# Patient Record
Sex: Male | Born: 1995 | Race: White | Hispanic: No | Marital: Married | Smoking: Former smoker
Health system: Southern US, Community
[De-identification: ages and names within clinical notes are randomized; demographics above are authoritative.]

## PROBLEM LIST (undated history)

## (undated) DIAGNOSIS — F909 Attention-deficit hyperactivity disorder, unspecified type: Secondary | ICD-10-CM

## (undated) DIAGNOSIS — F32A Depression, unspecified: Secondary | ICD-10-CM

## (undated) DIAGNOSIS — F419 Anxiety disorder, unspecified: Secondary | ICD-10-CM

## (undated) DIAGNOSIS — F329 Major depressive disorder, single episode, unspecified: Secondary | ICD-10-CM

## (undated) DIAGNOSIS — J45909 Unspecified asthma, uncomplicated: Secondary | ICD-10-CM

## (undated) HISTORY — PX: APPENDECTOMY: SHX54

---
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
## 2017-02-09 ENCOUNTER — Encounter: Payer: Self-pay | Admitting: Allergy and Immunology

## 2017-02-09 ENCOUNTER — Ambulatory Visit (INDEPENDENT_AMBULATORY_CARE_PROVIDER_SITE_OTHER): Payer: Medicaid Other | Admitting: Allergy and Immunology

## 2017-02-09 VITALS — BP 130/88 | HR 60 | Temp 98.4°F | Resp 16 | Ht 66.8 in | Wt 151.4 lb

## 2017-02-09 DIAGNOSIS — J111 Influenza due to unidentified influenza virus with other respiratory manifestations: Secondary | ICD-10-CM

## 2017-02-09 DIAGNOSIS — J4531 Mild persistent asthma with (acute) exacerbation: Secondary | ICD-10-CM

## 2017-02-09 DIAGNOSIS — Z720 Tobacco use: Secondary | ICD-10-CM | POA: Diagnosis not present

## 2017-02-09 MED ORDER — FLUTICASONE PROPIONATE 50 MCG/ACT NA SUSP: NASAL | 5 refills | Status: DC

## 2017-02-09 MED ORDER — MONTELUKAST SODIUM 10 MG PO TABS: ORAL_TABLET | ORAL | 5 refills | Status: DC

## 2017-02-09 MED ORDER — BUDESONIDE-FORMOTEROL FUMARATE 160-4.5 MCG/ACT IN AERO: INHALATION_SPRAY | RESPIRATORY_TRACT | 5 refills | Status: DC

## 2017-02-09 NOTE — Patient Instructions (Addendum)
  1. Finish Tamiflu administration  2. Treat inflammation:   A. prednisone 10 mg - one tablet 1 time per day for 10 days  B. Symbicort 160 - 2 inhalations twice a day with spacer  C. montelukast 10 mg - one tablet once a day  D. Flonase - one spray each nostril once a day  3. If needed:   A. nasal saline multiple times a day  B. loratadine 10 mg - one tablet twice a day  C. OTC Mucinex DM - 2 tablets twice a day  D. Proair HFA or similar - 2 inhalations every 4-6 hours  4. Review previous chest x-rays  5. Contact clinic if worse or develop fever  6. Return to clinic in 2 weeks or earlier if problem  7. Stay away from tobacco products

## 2017-02-09 NOTE — Progress Notes (Signed)
Dear Dr. Brigid Re,  Thank you for referring Calvin Riley to the [facility] [place] on 02/09/2017.   Below is a summation of this patient's evaluation and recommendations.  Thank you for your referral. I will keep you informed about this patient's response to treatment.   If you have any questions please do not hesitate to contact me.   Sincerely,  Jessica Priest, MD Allergy / Immunology [place] [facility]   ______________________________________________________________________    NEW PATIENT NOTE  Referring Provider: Loma Messing, MD Primary Provider: Loma Messing, MD Date of office visit: 02/09/2017    Subjective:   Chief Complaint:  Calvin Riley (DOB: [birth year]) is a 21 y.o. male who presents to the clinic on 02/09/2017 with a chief complaint of Cough .     HPI: Calvin Riley presents to this clinic in evaluation of cough. Apparently he has had 4 months of cough. Tied up with this cough has been 2 episodes of swab positive influenza with his most recent bout occurring last week evaluated and treated in the emergency room setting.  Currently he presents with a history of cough that does not appear to disturb his sleep in conjunction with nasal congestion and snorting but no ugly nasal discharge or anosmia. He does not have a long history of upper airway symptoms and although he has a distant history of asthma this condition has been inactive for many years and he has not used a bronchodilator in years. He does not make any ugly sputum production or have any chest pain and he can still exercise without any problem and he can still have exposure to cold air without any problem.  Apparently a chest x-ray obtained in the emergency room setting was without any significant abnormality.  He has been treated with many medications including what sounds like an inhaled steroid for the past several  months and a short acting bronchodilator which he is not really sure has helped him very much and 2 courses of Tamiflu and various cough suppressants.  Past Medical History:  Diagnosis Date  . ADHD (attention deficit hyperactivity disorder)   . Anxiety   . Asthma   . Depression     Past Surgical History:  Procedure Laterality Date  . APPENDECTOMY      Allergies as of 02/09/2017      Reactions   Amoxicillin    Zithromax [azithromycin]       Medication List      albuterol 108 (90 Base) MCG/ACT inhaler Commonly known as:  PROVENTIL HFA;VENTOLIN HFA Inhale 2 puffs into the lungs every 6 (six) hours as needed for wheezing or shortness of breath.       Review of systems negative except as noted in HPI / PMHx or noted below:  Review of Systems  Constitutional: Negative.   HENT: Negative.   Eyes: Negative.   Respiratory: Negative.   Cardiovascular: Negative.   Gastrointestinal: Negative.   Genitourinary: Negative.   Musculoskeletal: Negative.   Skin: Negative.   Neurological: Negative.   Endo/Heme/Allergies: Negative.   Psychiatric/Behavioral: Negative.     Family History  Problem Relation Age of Onset  . Family history unknown: Yes    Social History   Social History  . Marital status: Married    Spouse name: N/A  . Number of children: N/A  . Years of education: N/A   Occupational History  . Not on file.   Social  History Main Topics  . Smoking status: Former Smoker    Packs/day: 0.25    Types: Cigarettes    Quit date: 11/23/2014  . Smokeless tobacco: Current User    Last attempt to quit: 01/14/2015  . Alcohol use No  . Drug use: No  . Sexual activity: Not on file   Other Topics Concern  . Not on file   Social History Narrative  . No narrative on file    Environmental and Social history  Lives in a apartment with a dry environment, cats and dogs located inside the household, carpeting in the bedroom, no plastic on the bed or pillow, and no  smokers located inside the household. He occasionally chews tobacco.  Objective:   Vitals:   02/09/17 0914  BP: 130/88  Pulse: 60  Resp: 16  Temp: 98.4 F (36.9 C)   Height: 5' 6.8" (169.7 cm) Weight: 151 lb 6.4 oz (68.7 kg)  Physical Exam  Constitutional: He is well-developed, well-nourished, and in no distress.  Coughing, snorting  HENT:  Head: Normocephalic. Head is without right periorbital erythema and without left periorbital erythema.  Right Ear: Tympanic membrane, external ear and ear canal normal.  Left Ear: Tympanic membrane, external ear and ear canal normal.  Nose: Mucosal edema present. No rhinorrhea.  Mouth/Throat: Uvula is midline, oropharynx is clear and moist and mucous membranes are normal. No oropharyngeal exudate.  Eyes: Conjunctivae and lids are normal. Pupils are equal, round, and reactive to light.  Neck: Trachea normal. No tracheal tenderness present. No tracheal deviation present. No thyromegaly present.  Cardiovascular: Normal rate, regular rhythm, S1 normal, S2 normal and normal heart sounds.   No murmur heard. Pulmonary/Chest: Effort normal and breath sounds normal. No stridor. No tachypnea. No respiratory distress. He has no wheezes. He has no rales. He exhibits no tenderness.  Abdominal: Soft. He exhibits no distension and no mass. There is no hepatosplenomegaly. There is no tenderness. There is no rebound and no guarding.  Musculoskeletal: He exhibits no edema or tenderness.  Lymphadenopathy:       Head (right side): No tonsillar adenopathy present.       Head (left side): No tonsillar adenopathy present.    He has no cervical adenopathy.    He has no axillary adenopathy.  Neurological: He is alert. Gait normal.  Skin: No rash noted. He is not diaphoretic. No erythema. No pallor. Nails show no clubbing.  Psychiatric: Mood and affect normal.    Diagnostics: Allergy skin tests were not performed.   Spirometry was performed and demonstrated an  FEV1 of 3.31 @ 82 % of predicted. Following the administration of nebulized albuterol his FEV1 rose to 3.84 which calculated out to an increase in the FEV1 of 16%.   Assessment and Plan:    1. Asthma, not well controlled, mild persistent, with acute exacerbation   2. Influenza   3. Tobacco use     1. Finish Tamiflu administration  2. Treat inflammation:   A. prednisone 10 mg - one tablet 1 time per day for 10 days  B. Symbicort 160 - 2 inhalations twice a day with spacer  C. montelukast 10 mg - one tablet once a day  D. Flonase - one spray each nostril once a day  3. If needed:   A. nasal saline multiple times a day  B. loratadine 10 mg - one tablet twice a day  C. OTC Mucinex DM - 2 tablets twice a day  D. Proair HFA or  similar - 2 inhalations every 4-6 hours  4. Review previous chest x-rays  5. Contact clinic if worse or develop fever  6. Return to clinic in 2 weeks or earlier if problem  7. Stay away from tobacco products  I will assume that Chanetta Marshall has inflammation of his respiratory tract possibly secondary to her asthma but with his history of 2 episodes of influenza over the course of the past winter he very well could just have post viral inflammation and cough that will go on for an additional 4-8 weeks. I've treated him with anti-inflammatory medications as noted above addressing inflammation of his upper and lower airway. I'll see him back in this clinic in 2 weeks to see what type of response we get with this approach and consider further evaluation and treatment based upon his response.  Jessica Priest, MD [place] [facility] [place]

## 2017-02-19 ENCOUNTER — Ambulatory Visit: Payer: Medicaid Other | Admitting: Allergy and Immunology

## 2017-02-23 ENCOUNTER — Encounter: Payer: Self-pay | Admitting: Allergy and Immunology

## 2017-02-23 ENCOUNTER — Ambulatory Visit (INDEPENDENT_AMBULATORY_CARE_PROVIDER_SITE_OTHER): Payer: Medicaid Other | Admitting: Allergy and Immunology

## 2017-02-23 VITALS — BP 120/70 | HR 80 | Resp 16

## 2017-02-23 DIAGNOSIS — J4541 Moderate persistent asthma with (acute) exacerbation: Secondary | ICD-10-CM

## 2017-02-23 DIAGNOSIS — J329 Chronic sinusitis, unspecified: Secondary | ICD-10-CM

## 2017-02-23 DIAGNOSIS — Z720 Tobacco use: Secondary | ICD-10-CM | POA: Diagnosis not present

## 2017-02-23 DIAGNOSIS — K219 Gastro-esophageal reflux disease without esophagitis: Secondary | ICD-10-CM | POA: Diagnosis not present

## 2017-02-23 DIAGNOSIS — J3089 Other allergic rhinitis: Secondary | ICD-10-CM

## 2017-02-23 MED ORDER — OMEPRAZOLE 40 MG PO CPDR: 40.0000 mg | DELAYED_RELEASE_CAPSULE | Freq: Every day | ORAL | 5 refills | Status: DC

## 2017-02-23 MED ORDER — LORATADINE 10 MG PO TABS: ORAL_TABLET | ORAL | 5 refills | Status: DC

## 2017-02-23 MED ORDER — METHYLPREDNISOLONE ACETATE 80 MG/ML IJ SUSP
80.0000 mg | Freq: Once | INTRAMUSCULAR | Status: AC
  Administered 2017-02-23: 80 mg via INTRAMUSCULAR

## 2017-02-23 MED ORDER — CEFDINIR 300 MG PO CAPS: ORAL_CAPSULE | ORAL | 0 refills | Status: DC

## 2017-02-23 NOTE — Progress Notes (Signed)
Follow-up Note  Referring Provider: Loma Messing, MD Primary Provider: Loma Messing, MD Date of Office Visit: 02/23/2017  Subjective:   Calvin Riley (DOB: [birth year]) is a 21 y.o. male who [facility] on 02/23/2017 in re-evaluation of the following:  HPI: Calvin Riley returns to this clinic in reevaluation of his cough. He was last seen in this clinic on 02/09/2017 at which point in time we made the assumption that his cough was a postinfectious form of cough with a possible component of inflammation affecting his respiratory tract that may of had an atopic nature.  Although he is better somewhat with this cough in that he is not coughing as much he still has a lot of throat clearing and he still has some occasional sniffing and nasal congestion although he has no anosmia or ugly nasal discharge and has no raspy voice and has no classic reflux symptoms. He has not been having any wheezing or shortness of breath. He does not use a short acting bronchodilator but has been good about using his other medications.  He still continues to dip tobacco products  Allergies as of 02/23/2017      Reactions   Amoxicillin    Zithromax [azithromycin]       Medication List      budesonide-formoterol 160-4.5 MCG/ACT inhaler Commonly known as:  SYMBICORT Inhale two puffs twice daily to prevent cough or wheeze. Rinse mouth after use. Use with spacer.   fluticasone 50 MCG/ACT nasal spray Commonly known as:  FLONASE Use one spray in each nostril once daily   montelukast 10 MG tablet Commonly known as:  SINGULAIR Take one tablet once daily as directed   PROAIR HFA 108 (90 Base) MCG/ACT inhaler Generic drug:  albuterol Inhale 2 puffs into the lungs every 4 (four) hours as needed for wheezing or shortness of breath.       Past Medical History:  Diagnosis Date  . ADHD (attention deficit hyperactivity disorder)   . Anxiety   . Asthma   . Depression      Past Surgical History:  Procedure Laterality Date  . APPENDECTOMY      Review of systems negative except as noted in HPI / PMHx or noted below:  Review of Systems  Constitutional: Negative.   HENT: Negative.   Eyes: Negative.   Respiratory: Negative.   Cardiovascular: Negative.   Gastrointestinal: Negative.   Genitourinary: Negative.   Musculoskeletal: Negative.   Skin: Negative.   Neurological: Negative.   Endo/Heme/Allergies: Negative.   Psychiatric/Behavioral: Negative.      Objective:   Vitals:   02/23/17 1752  BP: 120/70  Pulse: 80  Resp: 16          Physical Exam  Constitutional: He is well-developed, well-nourished, and in no distress.  Constant throat clearing, sniffing  HENT:  Head: Normocephalic.  Right Ear: Tympanic membrane, external ear and ear canal normal.  Left Ear: Tympanic membrane, external ear and ear canal normal.  Nose: Nose normal. No mucosal edema or rhinorrhea.  Mouth/Throat: Uvula is midline, oropharynx is clear and moist and mucous membranes are normal. No oropharyngeal exudate.  Eyes: Conjunctivae are normal.  Neck: Trachea normal. No tracheal tenderness present. No tracheal deviation present. No thyromegaly present.  Cardiovascular: Normal rate, regular rhythm, S1 normal, S2 normal and normal heart sounds.   No murmur heard. Pulmonary/Chest: Breath sounds normal. No stridor. No respiratory distress. He has no wheezes. He has no rales.  Musculoskeletal: He  exhibits no edema.  Lymphadenopathy:       Head (right side): No tonsillar adenopathy present.       Head (left side): No tonsillar adenopathy present.    He has no cervical adenopathy.  Neurological: He is alert. Gait normal.  Skin: No rash noted. He is not diaphoretic. No erythema. Nails show no clubbing.  Psychiatric: Mood and affect normal.    Diagnostics: Review her chest x-ray dated 02/03/2017 identified no active cardiopulmonary disease.   Spirometry was performed  and demonstrated an FEV1 of 3.25 at 80 % of predicted.   Assessment and Plan:   1. Asthma, not well controlled, moderate persistent, with acute exacerbation   2. Other allergic rhinitis   3. Chronic sinusitis, unspecified location   4. LPRD (laryngopharyngeal reflux disease)   5. Tobacco use     1. Treat infection:   A. Cefdinir  two tablets one time per day for 14 days  2. Treat reflux:   A. Omeprazole  one tablet one time per day  B. Aim to eliminate all caffeine and chocolate consumption  2. Treat inflammation:   A. Symbicort 160 - 2 inhalations twice a day with spacer  B. montelukast 10 mg - one tablet once a day  C. Flonase - one spray each nostril once a day  D. Depomedrol 80 IM delivered in clinic today  3. If needed:   A. nasal saline multiple times a day  B. loratadine 10 mg - one tablet twice a day  C. OTC Mucinex DM - 2 tablets twice a day  D. Proair HFA or similar - 2 inhalations every 4-6 hours  4. Return to clinic in 4 weeks or earlier if problem  5. Stay away from tobacco products  Calvin Riley will use a very large collection of medical therapy in an attempt to address both inflammation, chronic infection, and reflux in the hope of getting all his respiratory tract inflammation and irritation under good control. We will keep him on this plan for 4 weeks. I suspect that he will do very well and we'll not have any respiratory tract symptoms at that point and then will begin to taper his medications. If he has difficulty during the interval he will contact me for further evaluation and treatment.  Laurette Schimke, MD Allergy / Immunology [facility]

## 2017-02-23 NOTE — Patient Instructions (Addendum)
  1. Treat infection:   A. Cefdinir  two tablets one time per day for 14 days  2. Treat reflux:   A. Omeprazole  one tablet one time per day  B. Aim to eliminate all caffeine and chocolate consumption  2. Treat inflammation:   A. Symbicort 160 - 2 inhalations twice a day with spacer  B. montelukast 10 mg - one tablet once a day  C. Flonase - one spray each nostril once a day  D. Depomedrol 80 IM delivered in clinic today  3. If needed:   A. nasal saline multiple times a day  B. loratadine 10 mg - one tablet twice a day  C. OTC Mucinex DM - 2 tablets twice a day  D. Proair HFA or similar - 2 inhalations every 4-6 hours  4. Return to clinic in 4 weeks or earlier if problem  5. Stay away from tobacco products

## 2017-03-23 ENCOUNTER — Ambulatory Visit: Payer: Medicaid Other | Admitting: Allergy and Immunology

## 2018-08-27 ENCOUNTER — Encounter (HOSPITAL_COMMUNITY): Payer: Self-pay | Admitting: *Deleted

## 2018-08-27 ENCOUNTER — Emergency Department (HOSPITAL_COMMUNITY)
Admission: EM | Admit: 2018-08-27 | Discharge: 2018-08-27 | Disposition: A | Discharge status: Home / Self Care | Payer: Self-pay | Attending: Emergency Medicine | Admitting: Emergency Medicine

## 2018-08-27 ENCOUNTER — Emergency Department (HOSPITAL_COMMUNITY): Payer: Self-pay

## 2018-08-27 DIAGNOSIS — R112 Nausea with vomiting, unspecified: Secondary | ICD-10-CM | POA: Insufficient documentation

## 2018-08-27 DIAGNOSIS — J45909 Unspecified asthma, uncomplicated: Secondary | ICD-10-CM | POA: Insufficient documentation

## 2018-08-27 DIAGNOSIS — Z87891 Personal history of nicotine dependence: Secondary | ICD-10-CM | POA: Insufficient documentation

## 2018-08-27 DIAGNOSIS — R079 Chest pain, unspecified: Secondary | ICD-10-CM

## 2018-08-27 LAB — CBC
HCT: 50.8 % (ref 39.0–52.0)
HEMOGLOBIN: 17 g/dL (ref 13.0–17.0)
MCH: 28.8 pg (ref 26.0–34.0)
MCHC: 33.5 g/dL (ref 30.0–36.0)
MCV: 86.1 fL (ref 80.0–100.0)
Platelets: 206 10*3/uL (ref 150–400)
RBC: 5.9 MIL/uL — ABNORMAL HIGH (ref 4.22–5.81)
RDW: 11.6 % (ref 11.5–15.5)
WBC: 15.7 10*3/uL — AB (ref 4.0–10.5)
nRBC: 0 % (ref 0.0–0.2)

## 2018-08-27 LAB — BASIC METABOLIC PANEL
Anion gap: 12 (ref 5–15)
BUN: 17 mg/dL (ref 6–20)
CO2: 21 mmol/L — ABNORMAL LOW (ref 22–32)
CREATININE: 1.07 mg/dL (ref 0.61–1.24)
Calcium: 9.9 mg/dL (ref 8.9–10.3)
Chloride: 106 mmol/L (ref 98–111)
GFR calc Af Amer: >60 mL/min (ref >60)
Glucose, Bld: 107 mg/dL — ABNORMAL HIGH (ref 70–99)
POTASSIUM: 3.9 mmol/L (ref 3.5–5.1)
SODIUM: 139 mmol/L (ref 135–145)

## 2018-08-27 LAB — URINALYSIS, ROUTINE W REFLEX MICROSCOPIC
Bacteria, UA: NONE SEEN
Bilirubin Urine: NEGATIVE
GLUCOSE, UA: NEGATIVE mg/dL
HGB URINE DIPSTICK: NEGATIVE
Ketones, ur: NEGATIVE mg/dL
Leukocytes, UA: NEGATIVE
NITRITE: NEGATIVE
Protein, ur: NEGATIVE mg/dL
SPECIFIC GRAVITY, URINE: 1.023 (ref 1.005–1.030)
pH: 5 (ref 5.0–8.0)

## 2018-08-27 LAB — LIPASE, BLOOD: Lipase: 25 U/L (ref 11–51)

## 2018-08-27 LAB — HEPATIC FUNCTION PANEL
ALT: 21 U/L (ref 0–44)
AST: 24 U/L (ref 15–41)
Albumin: 5 g/dL (ref 3.5–5.0)
Alkaline Phosphatase: 71 U/L (ref 38–126)
Bilirubin, Direct: 0.3 mg/dL — ABNORMAL HIGH (ref 0.0–0.2)
Indirect Bilirubin: 1.2 mg/dL — ABNORMAL HIGH (ref 0.3–0.9)
Total Bilirubin: 1.5 mg/dL — ABNORMAL HIGH (ref 0.3–1.2)
Total Protein: 7.7 g/dL (ref 6.5–8.1)

## 2018-08-27 LAB — I-STAT TROPONIN, ED: TROPONIN I, POC: 0 ng/mL (ref 0.00–0.08)

## 2018-08-27 MED ORDER — SODIUM CHLORIDE 0.9 % IV BOLUS
1000.0000 mL | Freq: Once | INTRAVENOUS | Status: AC
Start: 2018-08-27 — End: 2018-08-27
  Administered 2018-08-27: 1000 mL via INTRAVENOUS

## 2018-08-27 MED ORDER — SODIUM CHLORIDE 0.9 % IV BOLUS
1000.0000 mL | Freq: Once | INTRAVENOUS | Status: AC
  Administered 2018-08-27: 1000 mL via INTRAVENOUS

## 2018-08-27 MED ORDER — ONDANSETRON HCL 4 MG PO TABS: 4.0000 mg | ORAL_TABLET | Freq: Four times a day (QID) | ORAL | 0 refills | Status: AC

## 2018-08-27 MED ORDER — ONDANSETRON HCL 4 MG/2ML IJ SOLN
4.0000 mg | Freq: Once | INTRAMUSCULAR | Status: AC
  Administered 2018-08-27: 4 mg via INTRAVENOUS
  Filled 2018-08-27: qty 2

## 2018-08-27 MED ORDER — ACETAMINOPHEN 325 MG PO TABS: 650.0000 mg | ORAL_TABLET | Freq: Once | ORAL | Status: DC

## 2018-08-27 NOTE — ED Notes (Signed)
Pt's mother is requesting flu test for her son. She is concerned he might have the flu. Will notify MD and see what orders they want.

## 2018-08-27 NOTE — ED Triage Notes (Addendum)
Pt in c/o vomiting, chest pain and shortness of breath, concerned he has food poisoning from [employer] he had last night, woke up with symptoms, mother reports patient does use e-cigarretts and uses THC cartridges

## 2018-08-27 NOTE — ED Notes (Signed)
Patient verbalizes understanding of discharge instructions. Opportunity for questioning and answers were provided. Armband removed by staff, pt discharged from ED ambulatory with mother.  

## 2018-08-27 NOTE — ED Notes (Signed)
Patient transported to X-ray 

## 2018-08-27 NOTE — ED Provider Notes (Signed)
[facility] EMERGENCY DEPARTMENT Provider Note   CSN: [id number] Arrival date & time: 08/27/18  1339     History   Chief Complaint Chief Complaint  Patient presents with  . Chest Pain    HPI Calvin Riley is a 22 y.o. male.  Multiple episodes of nausea vomiting this morning.  Patient blames his symptoms on eating [employer] food last evenings.  Review of systems positive for chest pain, fever, right flank pain.  No obvious abdominal pain.  Past medical history includes depression, ADHD, PTSD.  Severity of symptoms is moderate.  Nothing makes symptoms better or worse.     Past Medical History:  Diagnosis Date  . ADHD (attention deficit hyperactivity disorder)   . Anxiety   . Asthma   . Depression     Patient Active Problem List   Diagnosis Date Noted  . Major depression, recurrent (HCC) 01/23/2015  . Suicidal ideation 01/23/2015  . ADHD (attention deficit hyperactivity disorder), combined type 01/23/2015  . Generalized anxiety disorder 01/23/2015  . PTSD (post-traumatic stress disorder) 01/23/2015  . 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 APPENDECTOMY          Home Medications    Prior to Admission medications   Not on File    Family History Family History  Family history unknown: Yes    Social History Social History   Tobacco Use  . Smoking status: Former Smoker    Packs/day: 0.25    Types: Cigarettes    Last attempt to quit: 11/23/2014    Years since quitting: 3.7  . Smokeless tobacco: Current User    Last attempt to quit: 01/14/2015  Substance Use Topics  . Alcohol use: No  . Drug use: No     Allergies   Amoxicillin and Zithromax [azithromycin]   Review of Systems Review of Systems  All other systems reviewed and are negative.    Physical Exam Updated Vital Signs BP 123/88 (BP Location: Right Arm)   Pulse (!) 112   Temp 98 F (36.7  C) (Oral)   Resp (!) 22   SpO2 100%   Physical Exam  Constitutional: He is oriented to person, place, and time. He appears well-developed and well-nourished.  Dry mucous membranes.  HENT:  Head: Normocephalic and atraumatic.  Eyes: Conjunctivae are normal.  Neck: Neck supple.  Cardiovascular: Normal rate and regular rhythm.  Pulmonary/Chest: Effort normal and breath sounds normal.  Abdominal: Soft. Bowel sounds are normal.  Musculoskeletal: Normal range of motion.  Neurological: He is alert and oriented to person, place, and time.  Skin: Skin is warm and dry.  Psychiatric: He has a normal mood and affect. His behavior is normal.  Nursing note and vitals reviewed. GU: Right flank tenderness   ED Treatments / Results  Labs (all labs ordered are listed, but only abnormal results are displayed) Labs Reviewed  BASIC METABOLIC PANEL - Abnormal; Notable for the following components:      Result Value   CO2 21 (*)    Glucose, Bld 107 (*)    All other components within normal limits  CBC - Abnormal; Notable for the following components:   WBC 15.7 (*)    RBC 5.90 (*)    All other components within normal limits  HEPATIC FUNCTION PANEL  LIPASE, BLOOD  URINALYSIS, ROUTINE W REFLEX MICROSCOPIC  I-STAT TROPONIN, ED    EKG EKG Interpretation  Date/Time:  Friday August 27 2018 13:41:53 EDT Ventricular Rate:  113 PR Interval:  150 QRS Duration: 76 QT Interval:  294 QTC Calculation: 403 R Axis:   96 Text Interpretation:  Sinus tachycardia Rightward axis Septal infarct , age undetermined Abnormal ECG Confirmed by Donnetta Hutching ([id number]) on 08/27/2018 2:12:03 PM   Radiology Dg Chest 2 View  Result Date: 08/27/2018 CLINICAL DATA:  Chest pain and shortness of breath EXAM: CHEST - 2 VIEW COMPARISON:  None. FINDINGS: Lungs are clear. Heart size and pulmonary vascularity are normal. No adenopathy. No pneumothorax. No bone lesions. IMPRESSION: No edema or consolidation. Electronically  Signed   By: Bretta Bang III M.D.   On: 08/27/2018 14:51    Procedures Procedures (including critical care time)  Medications Ordered in ED Medications  sodium chloride 0.9 % bolus 1,000 mL (1,000 mLs Intravenous New Bag/Given 08/27/18 1426)  sodium chloride 0.9 % bolus 1,000 mL (has no administration in time range)  ondansetron (ZOFRAN) injection 4 mg (4 mg Intravenous Given 08/27/18 1425)     Initial Impression / Assessment and Plan / ED Course  I have reviewed the triage vital signs and the nursing notes.  Pertinent labs & imaging results that were available during my care of the patient were reviewed by me and considered in my medical decision making (see chart for details).     Patient presents with nausea, vomiting, chest pain, subjective fever, right flank pain.  He is mildly tachycardic.  White count 15.7.  He feels better after IV hydration.  Urinalysis pending.  No acute abdomen.  Discussed with Dr.Curatola.  Final Clinical Impressions(s) / ED Diagnoses   Final diagnoses:  Intractable vomiting with nausea, unspecified vomiting type  Chest pain, unspecified type    ED Discharge Orders    None       Donnetta Hutching, MD 08/27/18 1538

## 2018-08-27 NOTE — ED Provider Notes (Signed)
Assumed care of patient from Dr. Adriana Simas at 4 PM.  Patient likely with viral process.  Has had nausea and vomiting since this morning.  Ate suspicious food last night.  Patient thus far with unremarkable work-up, awaiting U/A.  Was mildly tachycardic when he came in that has improved with IV fluids.  Patient given IV Zofran.  Had unremarkable chest x-ray, EKG, labs.  Mild leukocytosis.  Likely from viral process.  Patient with no abdominal pain.  No signs to suggest intra-abdominal process such as appendicitis or gallbladder or liver etiology.  Urinalysis showed no signs of infection.  Patient given Tylenol.  Recommend increase hydration, Tylenol, Zofran at home.  Recommend follow-up with primary care doctor and told to return to ED if symptoms worsen.   Virgina Norfolk, DO 08/27/18 1637

## 2018-08-27 NOTE — ED Notes (Addendum)
Pt mother is concerned about possible kidney stones or gallbladder problems. Pts mother states "pt is having lower left abdominal pain and lower back pain. His sister also recently had her gallbladder removed and is concerned he needs the same". This RN informed the mother I would notify the MD of her concerns.

## 2020-04-06 IMAGING — CR DG CHEST 2V
2 series · 2 of 2 positions shown · non-contrast
Comparison: None.

CLINICAL DATA: Chest pain and shortness of breath

EXAM:
CHEST - 2 VIEW

[chest lat]
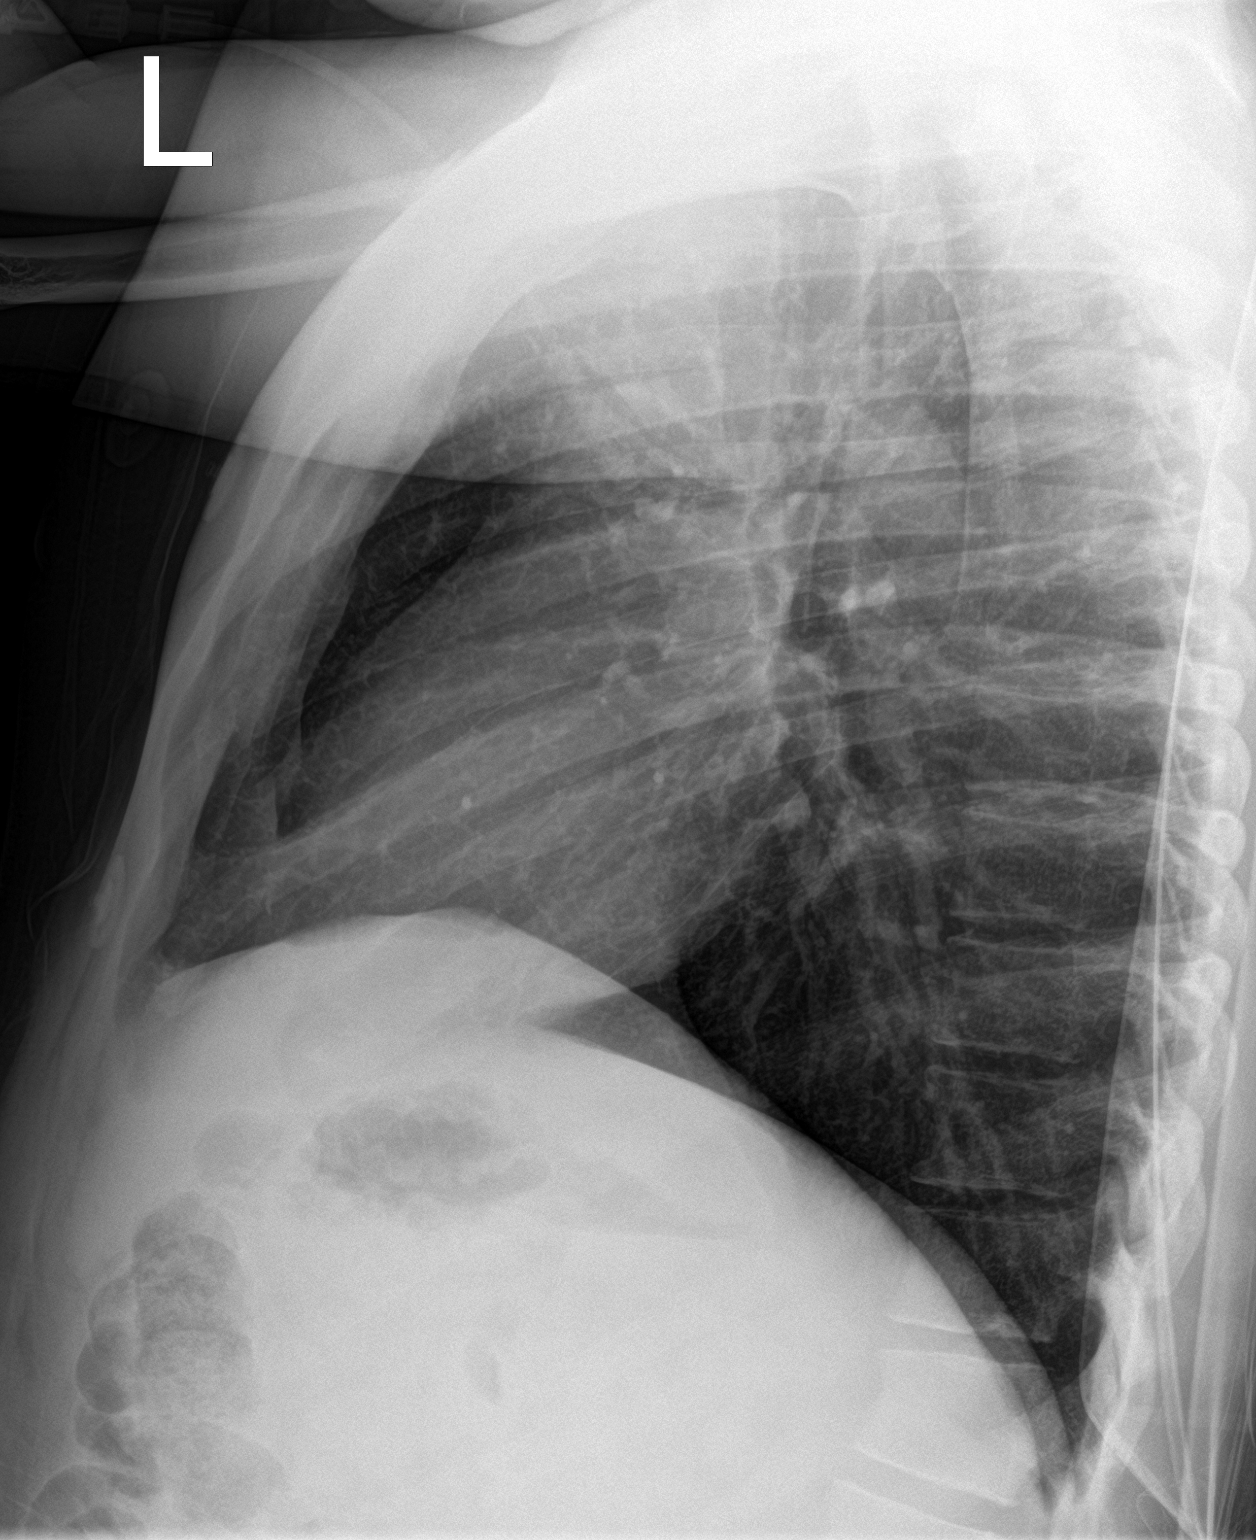

[chest ap]
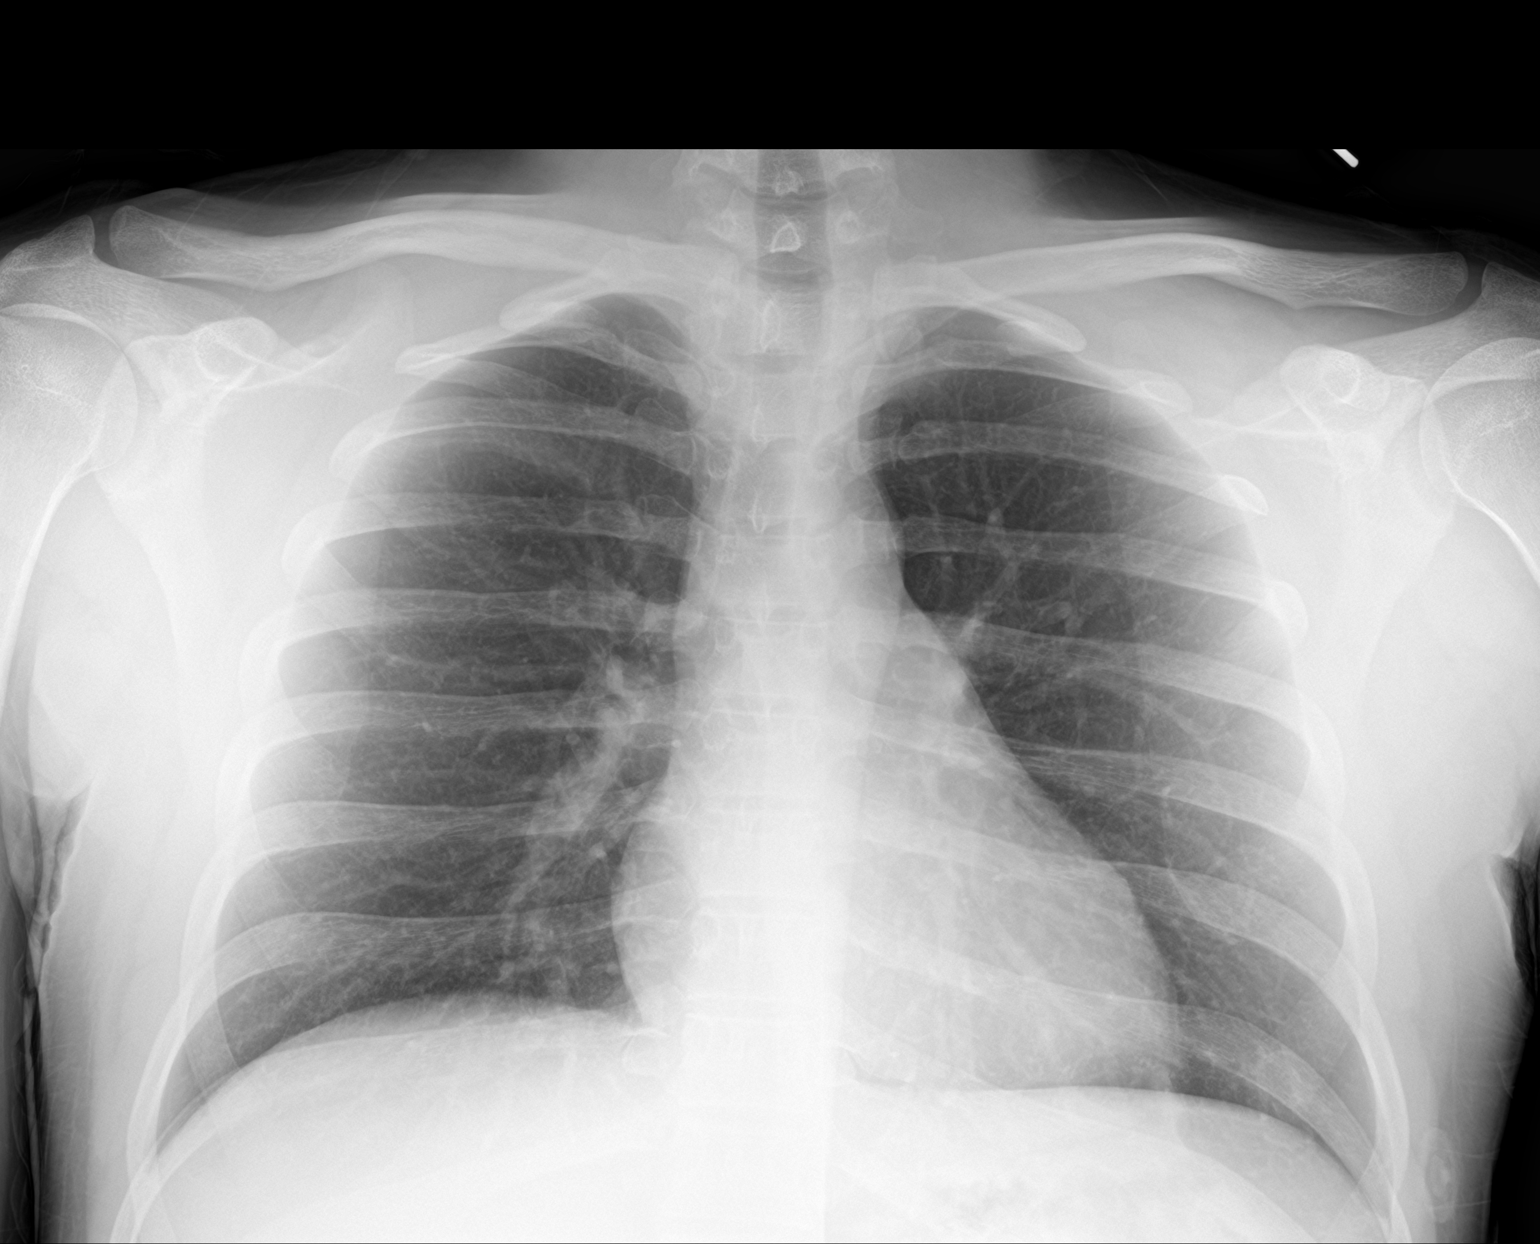

[2 of 2 positions shown; findings below may reference images not displayed]

FINDINGS: Lungs are clear. Heart size and pulmonary vascularity are normal. No
adenopathy. No pneumothorax. No bone lesions.
IMPRESSION: No edema or consolidation.
